# Patient Record
Sex: Male | Born: 1950 | Race: Asian | Hispanic: No | Marital: Married
Health system: Southern US, Community
[De-identification: ages and names within clinical notes are randomized; demographics above are authoritative.]

---

## 2016-10-18 DIAGNOSIS — Z79899 Other long term (current) drug therapy: Secondary | ICD-10-CM | POA: Diagnosis not present

## 2016-10-18 DIAGNOSIS — L408 Other psoriasis: Secondary | ICD-10-CM | POA: Diagnosis not present

## 2016-11-14 DIAGNOSIS — Z79899 Other long term (current) drug therapy: Secondary | ICD-10-CM | POA: Diagnosis not present

## 2016-11-22 DIAGNOSIS — L409 Psoriasis, unspecified: Secondary | ICD-10-CM | POA: Diagnosis not present

## 2016-11-22 DIAGNOSIS — E785 Hyperlipidemia, unspecified: Secondary | ICD-10-CM | POA: Diagnosis not present

## 2016-12-10 DIAGNOSIS — L409 Psoriasis, unspecified: Secondary | ICD-10-CM | POA: Diagnosis not present

## 2016-12-10 DIAGNOSIS — E785 Hyperlipidemia, unspecified: Secondary | ICD-10-CM | POA: Diagnosis not present

## 2016-12-10 DIAGNOSIS — R739 Hyperglycemia, unspecified: Secondary | ICD-10-CM | POA: Diagnosis not present

## 2016-12-20 DIAGNOSIS — Z79899 Other long term (current) drug therapy: Secondary | ICD-10-CM | POA: Diagnosis not present

## 2017-01-14 DIAGNOSIS — R739 Hyperglycemia, unspecified: Secondary | ICD-10-CM | POA: Diagnosis not present

## 2017-01-14 DIAGNOSIS — R634 Abnormal weight loss: Secondary | ICD-10-CM | POA: Diagnosis not present

## 2017-01-14 DIAGNOSIS — L409 Psoriasis, unspecified: Secondary | ICD-10-CM | POA: Diagnosis not present

## 2017-01-14 DIAGNOSIS — E7800 Pure hypercholesterolemia, unspecified: Secondary | ICD-10-CM | POA: Diagnosis not present

## 2017-01-17 DIAGNOSIS — Z79899 Other long term (current) drug therapy: Secondary | ICD-10-CM | POA: Diagnosis not present

## 2017-01-17 DIAGNOSIS — L400 Psoriasis vulgaris: Secondary | ICD-10-CM | POA: Diagnosis not present

## 2017-02-11 DIAGNOSIS — R634 Abnormal weight loss: Secondary | ICD-10-CM | POA: Diagnosis not present

## 2017-02-12 DIAGNOSIS — L409 Psoriasis, unspecified: Secondary | ICD-10-CM | POA: Diagnosis not present

## 2017-02-12 DIAGNOSIS — R7612 Nonspecific reaction to cell mediated immunity measurement of gamma interferon antigen response without active tuberculosis: Secondary | ICD-10-CM | POA: Diagnosis not present

## 2017-02-12 DIAGNOSIS — Z23 Encounter for immunization: Secondary | ICD-10-CM | POA: Diagnosis not present

## 2017-02-14 DIAGNOSIS — R739 Hyperglycemia, unspecified: Secondary | ICD-10-CM | POA: Diagnosis not present

## 2017-02-14 DIAGNOSIS — R634 Abnormal weight loss: Secondary | ICD-10-CM | POA: Diagnosis not present

## 2017-02-14 DIAGNOSIS — E7800 Pure hypercholesterolemia, unspecified: Secondary | ICD-10-CM | POA: Diagnosis not present

## 2017-02-14 DIAGNOSIS — L409 Psoriasis, unspecified: Secondary | ICD-10-CM | POA: Diagnosis not present

## 2017-02-22 DIAGNOSIS — R7612 Nonspecific reaction to cell mediated immunity measurement of gamma interferon antigen response without active tuberculosis: Secondary | ICD-10-CM | POA: Diagnosis not present

## 2017-03-21 DIAGNOSIS — R7612 Nonspecific reaction to cell mediated immunity measurement of gamma interferon antigen response without active tuberculosis: Secondary | ICD-10-CM | POA: Diagnosis not present

## 2017-04-11 DIAGNOSIS — R7611 Nonspecific reaction to tuberculin skin test without active tuberculosis: Secondary | ICD-10-CM | POA: Diagnosis not present

## 2017-04-15 DIAGNOSIS — E7800 Pure hypercholesterolemia, unspecified: Secondary | ICD-10-CM | POA: Diagnosis not present

## 2017-04-15 DIAGNOSIS — R739 Hyperglycemia, unspecified: Secondary | ICD-10-CM | POA: Diagnosis not present

## 2017-04-15 DIAGNOSIS — Z0000 Encounter for general adult medical examination without abnormal findings: Secondary | ICD-10-CM | POA: Diagnosis not present

## 2017-04-15 DIAGNOSIS — L409 Psoriasis, unspecified: Secondary | ICD-10-CM | POA: Diagnosis not present

## 2017-04-22 DIAGNOSIS — E118 Type 2 diabetes mellitus with unspecified complications: Secondary | ICD-10-CM | POA: Diagnosis not present

## 2017-04-22 DIAGNOSIS — Z0000 Encounter for general adult medical examination without abnormal findings: Secondary | ICD-10-CM | POA: Diagnosis not present

## 2017-04-22 DIAGNOSIS — H539 Unspecified visual disturbance: Secondary | ICD-10-CM | POA: Diagnosis not present

## 2017-04-22 DIAGNOSIS — L409 Psoriasis, unspecified: Secondary | ICD-10-CM | POA: Diagnosis not present

## 2017-04-22 DIAGNOSIS — R351 Nocturia: Secondary | ICD-10-CM | POA: Diagnosis not present

## 2017-04-22 DIAGNOSIS — E7800 Pure hypercholesterolemia, unspecified: Secondary | ICD-10-CM | POA: Diagnosis not present

## 2017-06-14 DIAGNOSIS — H25013 Cortical age-related cataract, bilateral: Secondary | ICD-10-CM | POA: Diagnosis not present

## 2017-06-14 DIAGNOSIS — H2513 Age-related nuclear cataract, bilateral: Secondary | ICD-10-CM | POA: Diagnosis not present

## 2017-06-14 DIAGNOSIS — H35033 Hypertensive retinopathy, bilateral: Secondary | ICD-10-CM | POA: Diagnosis not present

## 2017-06-14 DIAGNOSIS — E119 Type 2 diabetes mellitus without complications: Secondary | ICD-10-CM | POA: Diagnosis not present

## 2017-08-01 DIAGNOSIS — Z23 Encounter for immunization: Secondary | ICD-10-CM | POA: Diagnosis not present

## 2017-08-01 DIAGNOSIS — R7611 Nonspecific reaction to tuberculin skin test without active tuberculosis: Secondary | ICD-10-CM | POA: Diagnosis not present

## 2017-08-01 DIAGNOSIS — Z79899 Other long term (current) drug therapy: Secondary | ICD-10-CM | POA: Diagnosis not present

## 2017-08-01 DIAGNOSIS — L409 Psoriasis, unspecified: Secondary | ICD-10-CM | POA: Diagnosis not present

## 2017-09-05 DIAGNOSIS — L408 Other psoriasis: Secondary | ICD-10-CM | POA: Diagnosis not present

## 2017-10-17 DIAGNOSIS — E118 Type 2 diabetes mellitus with unspecified complications: Secondary | ICD-10-CM | POA: Diagnosis not present

## 2017-10-24 DIAGNOSIS — E7800 Pure hypercholesterolemia, unspecified: Secondary | ICD-10-CM | POA: Diagnosis not present

## 2017-10-24 DIAGNOSIS — E119 Type 2 diabetes mellitus without complications: Secondary | ICD-10-CM | POA: Diagnosis not present

## 2017-11-05 DIAGNOSIS — Z792 Long term (current) use of antibiotics: Secondary | ICD-10-CM | POA: Diagnosis not present

## 2017-11-05 DIAGNOSIS — R7611 Nonspecific reaction to tuberculin skin test without active tuberculosis: Secondary | ICD-10-CM | POA: Diagnosis not present

## 2017-12-07 ENCOUNTER — Emergency Department (HOSPITAL_COMMUNITY)
Admission: EM | Admit: 2017-12-07 | Discharge: 2017-12-07 | Disposition: A | Discharge status: Home / Self Care | Payer: PPO | Attending: Emergency Medicine | Admitting: Emergency Medicine

## 2017-12-07 ENCOUNTER — Emergency Department (HOSPITAL_COMMUNITY): Payer: PPO

## 2017-12-07 DIAGNOSIS — W101XXA Fall (on)(from) sidewalk curb, initial encounter: Secondary | ICD-10-CM

## 2017-12-07 DIAGNOSIS — R55 Syncope and collapse: Secondary | ICD-10-CM

## 2017-12-07 DIAGNOSIS — Z23 Encounter for immunization: Secondary | ICD-10-CM | POA: Diagnosis not present

## 2017-12-07 DIAGNOSIS — Z79899 Other long term (current) drug therapy: Secondary | ICD-10-CM | POA: Insufficient documentation

## 2017-12-07 DIAGNOSIS — S0081XA Abrasion of other part of head, initial encounter: Secondary | ICD-10-CM | POA: Diagnosis not present

## 2017-12-07 DIAGNOSIS — R51 Headache: Secondary | ICD-10-CM | POA: Diagnosis not present

## 2017-12-07 LAB — BASIC METABOLIC PANEL
Anion gap: 10 (ref 5–15)
BUN: 15 mg/dL (ref 6–20)
CO2: 27 mmol/L (ref 22–32)
Calcium: 9.3 mg/dL (ref 8.9–10.3)
Chloride: 101 mmol/L (ref 101–111)
Creatinine, Ser: 0.89 mg/dL (ref 0.61–1.24)
GFR calc Af Amer: >60 mL/min (ref >60)
GFR calc non Af Amer: >60 mL/min (ref >60)
Glucose, Bld: 186 mg/dL — ABNORMAL HIGH (ref 65–99)
Potassium: 4.1 mmol/L (ref 3.5–5.1)
Sodium: 138 mmol/L (ref 135–145)

## 2017-12-07 LAB — I-STAT TROPONIN, ED: TROPONIN I, POC: 0 ng/mL (ref 0.00–0.08)

## 2017-12-07 LAB — URINALYSIS, ROUTINE W REFLEX MICROSCOPIC
Bilirubin Urine: NEGATIVE
Glucose, UA: 150 mg/dL — AB
Hgb urine dipstick: NEGATIVE
Ketones, ur: NEGATIVE mg/dL
Leukocytes, UA: NEGATIVE
Nitrite: NEGATIVE
Protein, ur: NEGATIVE mg/dL
Specific Gravity, Urine: 1.016 (ref 1.005–1.030)
pH: 6 (ref 5.0–8.0)

## 2017-12-07 LAB — RAPID URINE DRUG SCREEN, HOSP PERFORMED
Amphetamines: NOT DETECTED
BARBITURATES: NOT DETECTED
Benzodiazepines: NOT DETECTED
Cocaine: NOT DETECTED
Opiates: NOT DETECTED
TETRAHYDROCANNABINOL: NOT DETECTED

## 2017-12-07 LAB — HEPATIC FUNCTION PANEL
ALBUMIN: 3.7 g/dL (ref 3.5–5.0)
ALT: 35 U/L (ref 17–63)
AST: 34 U/L (ref 15–41)
Alkaline Phosphatase: 59 U/L (ref 38–126)
BILIRUBIN DIRECT: 0.1 mg/dL (ref 0.1–0.5)
Indirect Bilirubin: 0.9 mg/dL (ref 0.3–0.9)
TOTAL PROTEIN: 6.1 g/dL — AB (ref 6.5–8.1)
Total Bilirubin: 1 mg/dL (ref 0.3–1.2)

## 2017-12-07 LAB — CBC
HCT: 43.1 % (ref 39.0–52.0)
Hemoglobin: 13.9 g/dL (ref 13.0–17.0)
MCH: 29.6 pg (ref 26.0–34.0)
MCHC: 32.3 g/dL (ref 30.0–36.0)
MCV: 91.9 fL (ref 78.0–100.0)
Platelets: 166 10*3/uL (ref 150–400)
RBC: 4.69 MIL/uL (ref 4.22–5.81)
RDW: 12.9 % (ref 11.5–15.5)
WBC: 7 10*3/uL (ref 4.0–10.5)

## 2017-12-07 LAB — CBG MONITORING, ED: Glucose-Capillary: 203 mg/dL — ABNORMAL HIGH (ref 65–99)

## 2017-12-07 MED ORDER — TETANUS-DIPHTH-ACELL PERTUSSIS 5-2.5-18.5 LF-MCG/0.5 IM SUSP
0.5000 mL | Freq: Once | INTRAMUSCULAR | Status: AC
  Administered 2017-12-07: 0.5 mL via INTRAMUSCULAR
  Filled 2017-12-07: qty 0.5

## 2017-12-07 NOTE — ED Provider Notes (Signed)
[place] EMERGENCY DEPARTMENT Provider Note   CSN: [id number] Arrival date & time: 12/07/17  1418     History   Chief Complaint Chief Complaint  Patient presents with  . Loss of Consciousness    HPI Chad Mcdonald is a 67 y.o. male.  HPI   Patient is a 67 year old male with a history of hyperlipidemia, T2 DM, COPD, pulmonary HTN,  who presents the ED today to be evaluated for syncopal episode that occurred PTA.  Episode was unwitnessed.  States he was squatting outside in an alley way smoking a cigarette when he tried to stand up and passed out.  Denies any prodrome of chest pain, shortness of breath, nausea, lightheadedness, vision changes prior to the episode.  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  He states he has had lightheadedness upon standing after squatting for extended periods of time in the past. He is now complaining of pain to the right side of his face.  Denies any headache or vision changes.  Denies any weakness or numbness to his bilateral upper or lower extremities.  Denies any neck or back pain.  No abdominal pain, nausea, vomiting, shortness of breath, chest pain.  No eye lightheadedness or dizziness.  He has never had an episode like this before.  States he has been in his usual state of health leading up to the incident today.  Denies any drug or alcohol use today.  States that he drank about 1 cup of bourbon last night which is the first time he has consumed alcohol about 15 years.  He is not on any blood thinners.  Patient denies any family history of sudden cardiac death.   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 Has been on isoniazid since 03/21/17 and is planned to have this discontinued at the end of this month. On review of prior notes to denied a h/o TB or known exposures to it. He is also on vit b6 supplementation.  No past  medical history on file.  There are no active problems to display for this patient.   Home Medications    Prior to Admission medications   Medication Sig Start Date End Date Taking? Authorizing Provider  atorvastatin (LIPITOR) 40 MG tablet Take 40 mg by mouth at bedtime. 10/25/17  Yes [provider]  Cholecalciferol (VITAMIN D PO) Take 1 tablet by mouth every evening.   Yes [provider]  isoniazid (NYDRAZID) 300 MG tablet Take 300 mg by mouth daily at 3 pm.  10/17/17  Yes [provider]  metFORMIN (GLUCOPHAGE-XR) 500 MG 24 hr tablet Take 500 mg by mouth at bedtime. 09/26/17  Yes [provider]  Multiple Vitamin (MULTIVITAMIN WITH MINERALS) TABS tablet Take 1 tablet by mouth daily. Centrum   Yes [provider]  triamcinolone ointment (KENALOG) 0.1 % Apply 1 application topically 2 (two) times daily. 09/05/17  Yes [provider]    Family History No family history on file.  Social History Social History   Tobacco Use  . Smoking status: Not on file  Substance Use Topics  . Alcohol use: Not on file  . Drug use: Not on file     Allergies   Patient has no known allergies.   Review of Systems Review of Systems  Constitutional: Negative for fever.  HENT: Negative for congestion, rhinorrhea  and sore throat.        Right sided facial pain  Respiratory: Negative for cough and shortness of breath.   Cardiovascular: Negative for chest pain and palpitations.  Gastrointestinal: Negative for abdominal pain, constipation, diarrhea, nausea and vomiting.  Genitourinary: Negative for flank pain.  Musculoskeletal: Negative for back pain and neck pain.  Skin:       abrasion  Neurological: Positive for syncope. Negative for dizziness, weakness, light-headedness, numbness and headaches.     Physical Exam Updated Vital Signs BP 120/66   Pulse 71   Temp 97.9 F (36.6 C) (Oral)   Resp 18   Ht 5\' 4"  (1.626 m)   Wt 59 kg (130 lb)    SpO2 97%   BMI 22.31 kg/m   Physical Exam  Constitutional: He is oriented to person, place, and time. He appears well-developed and well-nourished. No distress.  Nontoxic-appearing.  HENT:  Head: Normocephalic and atraumatic.  Abrasion to right cheek with some swelling to right side of face.  Mild tenderness to palpation along this area.  No obvious step-off or deformity.  No tenderness to palpation or hematoma to the remainder of the face or skull.  No hemotympanum.  No rhinorrhea.  No battle signs.  Oropharynx clear and moist.  Uvula midline.  Nose normal with no tenderness to palpation.  Eyes: Pupils are equal, round, and reactive to light. Conjunctivae and EOM are normal.  No pain with extraocular movements.  Subconjunctival hemorrhage noted to right side of right eye. No hyphema.  Neck: Normal range of motion. Neck supple.  Full and painless range of motion of the neck.  No C-spine tenderness.  Able to flex to 45 degrees bilaterally without pain.  No tracheal deviation.  Cardiovascular: Normal rate, regular rhythm, normal heart sounds and intact distal pulses.  No murmur heard. Pulmonary/Chest: Effort normal and breath sounds normal. No stridor. No respiratory distress. He has no wheezes. He has no rales.  Abdominal: Soft. Bowel sounds are normal. There is no tenderness. There is no guarding.  Musculoskeletal: He exhibits no edema.  Neurological: He is alert and oriented to person, place, and time.  Mental Status:  Alert, thought content appropriate, able to give a coherent history. Speech fluent without evidence of aphasia. Able to follow 2 step commands without difficulty.  Cranial Nerves:  II:  pupils equal, round, reactive to light III,IV, VI: ptosis not present, extra-ocular motions intact bilaterally  V,VII: smile symmetric, facial light touch sensation equal VIII: hearing grossly normal to voice  X: uvula elevates symmetrically  XI: bilateral shoulder shrug symmetric and  strong XII: midline tongue extension without fassiculations Motor:  Normal tone. 5/5 strength of BUE and BLE major muscle groups including strong and equal grip strength and dorsiflexion/plantar flexion Sensory: light touch normal in all extremities. DTRs: patellar 2+ symmetric b/l Cerebellar: normal finger-to-nose with bilateral upper extremities Gait: normal gait and balance. Able to walk on toes and heels with ease.  CV: 2+ radial and DP/PT pulses Memory intact.  No pronator drift.  Negative Romberg.  Skin: Skin is warm and dry. Capillary refill takes less than 2 seconds.  Abrasions to right cheek, right arm and hand  Psychiatric: He has a normal mood and affect.  Nursing note and vitals reviewed.    ED Treatments / Results  Labs (all labs ordered are listed, but only abnormal results are displayed) Labs Reviewed  BASIC METABOLIC PANEL - Abnormal; Notable for the following components:      Result  Value   Glucose, Bld 186 (*)    All other components within normal limits  URINALYSIS, ROUTINE W REFLEX MICROSCOPIC - Abnormal; Notable for the following components:   Glucose, UA 150 (*)    All other components within normal limits  HEPATIC FUNCTION PANEL - Abnormal; Notable for the following components:   Total Protein 6.1 (*)    All other components within normal limits  CBG MONITORING, ED - Abnormal; Notable for the following components:   Glucose-Capillary 203 (*)    All other components within normal limits  CBC  RAPID URINE DRUG SCREEN, HOSP PERFORMED  I-STAT TROPONIN, ED    EKG EKG Interpretation  Date/Time:  Saturday December 07 2017 14:35:57 EDT Ventricular Rate:  64 PR Interval:  152 QRS Duration: 128 QT Interval:  432 QTC Calculation: 445 R Axis:   62 Text Interpretation:  Normal sinus rhythm Right bundle branch block Abnormal ECG NO STEMI No old tracing to compare Confirmed by Addison Lank [phone]) on 12/07/2017 5:39:24 PM   Radiology Dg Chest 2 View  Result  Date: 12/07/2017 CLINICAL DATA:  Syncope EXAM: CHEST - 2 VIEW COMPARISON:  None. FINDINGS: The heart size and mediastinal contours are within normal limits. Both lungs are clear. The visualized skeletal structures are unremarkable. IMPRESSION: No active cardiopulmonary disease. Electronically Signed   By: Abelardo Diesel M.D.   On: 12/07/2017 17:51   Ct Head Wo Contrast  Result Date: 12/07/2017 CLINICAL DATA:  Patient found down.  Abrasion. EXAM: CT HEAD WITHOUT CONTRAST CT MAXILLOFACIAL WITHOUT CONTRAST TECHNIQUE: Multidetector CT imaging of the head and maxillofacial structures were performed using the standard protocol without intravenous contrast. Multiplanar CT image reconstructions of the maxillofacial structures were also generated. COMPARISON:  None. FINDINGS: CT HEAD FINDINGS Brain: No subdural, epidural, or subarachnoid hemorrhage. Cerebellum, brainstem, and basal cisterns are normal. Ventricles and sulci are unremarkable. No acute cortical ischemia or infarct. No mass effect or midline shift. Vascular: Calcified atherosclerosis is seen in the intracranial carotids. Skull: There is a small amount of fluid in the right maxillary sinus dependently. Paranasal sinuses, mastoid air cells, and middle ears are otherwise normal. No skull fractures are noted. Other: There is soft tissue swelling in the right periorbital region. The underlying globe is intact. Extracranial soft tissues are otherwise normal. CT MAXILLOFACIAL FINDINGS Osseous: No fracture or mandibular dislocation. No destructive process. Orbits: Negative. No traumatic or inflammatory finding. Sinuses: There is high attenuation fluid posteriorly in the right maxillary sinus. The paranasal sinuses are otherwise unremarkable with no other fluid or significant mucosal thickening. No fractures in the walls of the sinuses. Narrowing of the right ostiomeatal complex due to mucosal thickening. Frontal sinus drainage pathways are patent. Soft tissues: There  is soft tissue swelling adjacent to the right orbit. The underlying globe is intact. Extracranial soft tissues are otherwise normal. IMPRESSION: 1. No acute intracranial abnormality identified. 2. Fluid in the right maxillary sinus demonstrates increased attenuation and could represent hemorrhage. However, no underlying cause is seen. No fracture. No facial bone fractures noted. Specifically, the bones of the right orbit are intact. 3. Soft tissue swelling around the right orbit is identified. Electronically Signed   By: Dorise Bullion III M.D   On: 12/07/2017 16:07   Ct Maxillofacial Wo Contrast  Result Date: 12/07/2017 CLINICAL DATA:  Patient found down.  Abrasion. EXAM: CT HEAD WITHOUT CONTRAST CT MAXILLOFACIAL WITHOUT CONTRAST TECHNIQUE: Multidetector CT imaging of the head and maxillofacial structures were performed using the standard protocol without intravenous  contrast. Multiplanar CT image reconstructions of the maxillofacial structures were also generated. COMPARISON:  None. FINDINGS: CT HEAD FINDINGS Brain: No subdural, epidural, or subarachnoid hemorrhage. Cerebellum, brainstem, and basal cisterns are normal. Ventricles and sulci are unremarkable. No acute cortical ischemia or infarct. No mass effect or midline shift. Vascular: Calcified atherosclerosis is seen in the intracranial carotids. Skull: There is a small amount of fluid in the right maxillary sinus dependently. Paranasal sinuses, mastoid air cells, and middle ears are otherwise normal. No skull fractures are noted. Other: There is soft tissue swelling in the right periorbital region. The underlying globe is intact. Extracranial soft tissues are otherwise normal. CT MAXILLOFACIAL FINDINGS Osseous: No fracture or mandibular dislocation. No destructive process. Orbits: Negative. No traumatic or inflammatory finding. Sinuses: There is high attenuation fluid posteriorly in the right maxillary sinus. The paranasal sinuses are otherwise  unremarkable with no other fluid or significant mucosal thickening. No fractures in the walls of the sinuses. Narrowing of the right ostiomeatal complex due to mucosal thickening. Frontal sinus drainage pathways are patent. Soft tissues: There is soft tissue swelling adjacent to the right orbit. The underlying globe is intact. Extracranial soft tissues are otherwise normal. IMPRESSION: 1. No acute intracranial abnormality identified. 2. Fluid in the right maxillary sinus demonstrates increased attenuation and could represent hemorrhage. However, no underlying cause is seen. No fracture. No facial bone fractures noted. Specifically, the bones of the right orbit are intact. 3. Soft tissue swelling around the right orbit is identified. Electronically Signed   By: Dorise Bullion III M.D   On: 12/07/2017 16:07    Procedures Procedures (including critical care time)  Medications Ordered in ED Medications  Tdap (BOOSTRIX) injection 0.5 mL (0.5 mLs Intramuscular Given 12/07/17 1836)     Initial Impression / Assessment and Plan / ED Course  I have reviewed the triage vital signs and the nursing notes.  Pertinent labs & imaging results that were available during my care of the patient were reviewed by me and considered in my medical decision making (see chart for details).    Discussed pt presentation and exam findings with Dr. Leonette Monarch, who personally evaluated the pt and advises that the pt be discharged with outpt f/u and with referral to maxillofacial.   Final Clinical Impressions(s) / ED Diagnoses   Final diagnoses:  Syncope, unspecified syncope type   Patient without arrhythmia or tachycardia while here in the department.  Patient without history of congestive heart failure, normal hematocrit, ECG with NSR and RBBB which is old. No ischemic changes or arrhythmia, no shortness of breath and systolic blood pressure greater than 90; patient is low risk. Will plan for discharge home with close PCP  follow-up. Suspect symptoms due to orthostatic hypotension based on history. CT head negative for acute intracranial abnormality. CT maxillofacial with increased attenuation which could represent hemorrhage. No underlying cause or facial fractures identified. Advised pt to f/u with maxillofacial surgery for further eval and tx of possible right maxillary sinus hemorrhage.   Pt has remained hemodynamically stable throughout their time in the ED, he is in NAD and is nontoxic appearing. At this time do not suspect urgent underlying cardiac, pulmonary, or neurological etiology of patients sxs. labwork reviewed and is crossly normal. cxr negative. Pt safe for d/c with close outpt f/u. Wounds cleansed in ED and tdap updated.   BP 120/66   Pulse 71   Temp 97.9 F (36.6 C) (Oral)   Resp 18   Ht 5\' 4"  (1.626 m)  Wt 59 kg (130 lb)   SpO2 97%   BMI 22.31 kg/m    ED Discharge Orders    None       Bishop Dublin 12/07/17 2138    Fatima Blank, MD 12/08/17 1329

## 2017-12-07 NOTE — ED Notes (Signed)
Lab to add on UDS 

## 2017-12-07 NOTE — ED Triage Notes (Signed)
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

## 2017-12-07 NOTE — ED Notes (Signed)
Patient transported to X-ray 

## 2017-12-07 NOTE — Discharge Instructions (Signed)
You were found to have possible bleeding in the right maxillary sinus which could represent blood in your sinus.  You will need to follow up with Dr. Iran Planas from plastic surgery for further evaluation and treatment of this. You will also need to follow up with your primary doctor in 5-7 days for re-evaluation. You should return to the ED for any new or worsening symptoms including any chest pain, shortness of breath, passing out, lightheadedness.

## 2017-12-16 DIAGNOSIS — N529 Male erectile dysfunction, unspecified: Secondary | ICD-10-CM | POA: Diagnosis not present

## 2017-12-16 DIAGNOSIS — R05 Cough: Secondary | ICD-10-CM | POA: Diagnosis not present

## 2017-12-16 DIAGNOSIS — R55 Syncope and collapse: Secondary | ICD-10-CM | POA: Diagnosis not present

## 2017-12-16 DIAGNOSIS — R0789 Other chest pain: Secondary | ICD-10-CM | POA: Diagnosis not present

## 2017-12-16 DIAGNOSIS — W19XXXD Unspecified fall, subsequent encounter: Secondary | ICD-10-CM | POA: Diagnosis not present

## 2018-01-31 DIAGNOSIS — E119 Type 2 diabetes mellitus without complications: Secondary | ICD-10-CM | POA: Diagnosis not present

## 2018-01-31 DIAGNOSIS — E7800 Pure hypercholesterolemia, unspecified: Secondary | ICD-10-CM | POA: Diagnosis not present

## 2018-02-07 DIAGNOSIS — E7800 Pure hypercholesterolemia, unspecified: Secondary | ICD-10-CM | POA: Diagnosis not present

## 2018-02-07 DIAGNOSIS — E119 Type 2 diabetes mellitus without complications: Secondary | ICD-10-CM | POA: Diagnosis not present

## 2018-02-07 DIAGNOSIS — E559 Vitamin D deficiency, unspecified: Secondary | ICD-10-CM | POA: Diagnosis not present

## 2018-02-07 DIAGNOSIS — N529 Male erectile dysfunction, unspecified: Secondary | ICD-10-CM | POA: Diagnosis not present

## 2018-06-03 DIAGNOSIS — E118 Type 2 diabetes mellitus with unspecified complications: Secondary | ICD-10-CM | POA: Diagnosis not present

## 2018-06-03 DIAGNOSIS — E7800 Pure hypercholesterolemia, unspecified: Secondary | ICD-10-CM | POA: Diagnosis not present

## 2018-06-10 DIAGNOSIS — R05 Cough: Secondary | ICD-10-CM | POA: Diagnosis not present

## 2018-06-10 DIAGNOSIS — E559 Vitamin D deficiency, unspecified: Secondary | ICD-10-CM | POA: Diagnosis not present

## 2018-06-10 DIAGNOSIS — E118 Type 2 diabetes mellitus with unspecified complications: Secondary | ICD-10-CM | POA: Diagnosis not present

## 2018-06-10 DIAGNOSIS — N529 Male erectile dysfunction, unspecified: Secondary | ICD-10-CM | POA: Diagnosis not present

## 2018-06-10 DIAGNOSIS — E7800 Pure hypercholesterolemia, unspecified: Secondary | ICD-10-CM | POA: Diagnosis not present

## 2018-06-27 DIAGNOSIS — H2513 Age-related nuclear cataract, bilateral: Secondary | ICD-10-CM | POA: Diagnosis not present

## 2018-06-27 DIAGNOSIS — E119 Type 2 diabetes mellitus without complications: Secondary | ICD-10-CM | POA: Diagnosis not present

## 2018-06-27 DIAGNOSIS — H35033 Hypertensive retinopathy, bilateral: Secondary | ICD-10-CM | POA: Diagnosis not present

## 2018-06-27 DIAGNOSIS — H25013 Cortical age-related cataract, bilateral: Secondary | ICD-10-CM | POA: Diagnosis not present

## 2018-08-03 DIAGNOSIS — S299XXA Unspecified injury of thorax, initial encounter: Secondary | ICD-10-CM | POA: Diagnosis not present

## 2018-08-03 DIAGNOSIS — S2232XA Fracture of one rib, left side, initial encounter for closed fracture: Secondary | ICD-10-CM | POA: Diagnosis not present

## 2018-08-03 DIAGNOSIS — R58 Hemorrhage, not elsewhere classified: Secondary | ICD-10-CM | POA: Diagnosis not present

## 2018-08-03 DIAGNOSIS — S60512A Abrasion of left hand, initial encounter: Secondary | ICD-10-CM | POA: Diagnosis not present

## 2018-08-03 DIAGNOSIS — Y92410 Unspecified street and highway as the place of occurrence of the external cause: Secondary | ICD-10-CM | POA: Diagnosis not present

## 2018-08-03 DIAGNOSIS — R0789 Other chest pain: Secondary | ICD-10-CM | POA: Diagnosis not present

## 2018-08-03 DIAGNOSIS — S3991XA Unspecified injury of abdomen, initial encounter: Secondary | ICD-10-CM | POA: Diagnosis not present

## 2018-08-03 DIAGNOSIS — S8992XA Unspecified injury of left lower leg, initial encounter: Secondary | ICD-10-CM | POA: Diagnosis not present

## 2018-08-03 DIAGNOSIS — K5730 Diverticulosis of large intestine without perforation or abscess without bleeding: Secondary | ICD-10-CM | POA: Diagnosis not present

## 2018-08-03 DIAGNOSIS — M7989 Other specified soft tissue disorders: Secondary | ICD-10-CM | POA: Diagnosis not present

## 2018-08-03 DIAGNOSIS — S0081XA Abrasion of other part of head, initial encounter: Secondary | ICD-10-CM | POA: Diagnosis not present

## 2018-08-03 DIAGNOSIS — S199XXA Unspecified injury of neck, initial encounter: Secondary | ICD-10-CM | POA: Diagnosis not present

## 2018-08-03 DIAGNOSIS — R51 Headache: Secondary | ICD-10-CM | POA: Diagnosis not present

## 2018-08-03 DIAGNOSIS — I2510 Atherosclerotic heart disease of native coronary artery without angina pectoris: Secondary | ICD-10-CM | POA: Diagnosis not present

## 2018-08-03 DIAGNOSIS — S0990XA Unspecified injury of head, initial encounter: Secondary | ICD-10-CM | POA: Diagnosis not present

## 2018-08-03 DIAGNOSIS — Z23 Encounter for immunization: Secondary | ICD-10-CM | POA: Diagnosis not present

## 2018-08-03 DIAGNOSIS — S00211A Abrasion of right eyelid and periocular area, initial encounter: Secondary | ICD-10-CM | POA: Diagnosis not present

## 2018-08-03 DIAGNOSIS — S8001XA Contusion of right knee, initial encounter: Secondary | ICD-10-CM | POA: Diagnosis not present

## 2018-08-03 DIAGNOSIS — S0001XA Abrasion of scalp, initial encounter: Secondary | ICD-10-CM | POA: Diagnosis not present

## 2018-08-03 DIAGNOSIS — S8012XA Contusion of left lower leg, initial encounter: Secondary | ICD-10-CM | POA: Diagnosis not present

## 2018-08-03 DIAGNOSIS — R079 Chest pain, unspecified: Secondary | ICD-10-CM | POA: Diagnosis not present

## 2018-08-03 DIAGNOSIS — S60511A Abrasion of right hand, initial encounter: Secondary | ICD-10-CM | POA: Diagnosis not present

## 2018-08-03 DIAGNOSIS — I700 Atherosclerosis of aorta: Secondary | ICD-10-CM | POA: Diagnosis not present

## 2018-08-03 DIAGNOSIS — K8020 Calculus of gallbladder without cholecystitis without obstruction: Secondary | ICD-10-CM | POA: Diagnosis not present

## 2018-08-03 DIAGNOSIS — S8991XA Unspecified injury of right lower leg, initial encounter: Secondary | ICD-10-CM | POA: Diagnosis not present

## 2018-08-04 DIAGNOSIS — R079 Chest pain, unspecified: Secondary | ICD-10-CM | POA: Diagnosis not present

## 2018-08-04 DIAGNOSIS — S299XXA Unspecified injury of thorax, initial encounter: Secondary | ICD-10-CM | POA: Diagnosis not present

## 2018-08-04 DIAGNOSIS — I4510 Unspecified right bundle-branch block: Secondary | ICD-10-CM | POA: Diagnosis not present

## 2018-08-04 DIAGNOSIS — S3991XA Unspecified injury of abdomen, initial encounter: Secondary | ICD-10-CM | POA: Diagnosis not present

## 2018-08-05 DIAGNOSIS — Y92410 Unspecified street and highway as the place of occurrence of the external cause: Secondary | ICD-10-CM | POA: Diagnosis not present

## 2018-08-05 DIAGNOSIS — I4510 Unspecified right bundle-branch block: Secondary | ICD-10-CM | POA: Diagnosis not present

## 2018-08-05 DIAGNOSIS — Y998 Other external cause status: Secondary | ICD-10-CM | POA: Diagnosis not present

## 2018-08-05 DIAGNOSIS — R2242 Localized swelling, mass and lump, left lower limb: Secondary | ICD-10-CM | POA: Diagnosis not present

## 2018-08-05 DIAGNOSIS — R079 Chest pain, unspecified: Secondary | ICD-10-CM | POA: Diagnosis not present

## 2018-08-05 DIAGNOSIS — S3991XA Unspecified injury of abdomen, initial encounter: Secondary | ICD-10-CM | POA: Diagnosis not present

## 2018-08-05 DIAGNOSIS — S299XXA Unspecified injury of thorax, initial encounter: Secondary | ICD-10-CM | POA: Diagnosis not present

## 2018-08-05 DIAGNOSIS — S8012XA Contusion of left lower leg, initial encounter: Secondary | ICD-10-CM | POA: Diagnosis not present

## 2018-08-06 DIAGNOSIS — M79605 Pain in left leg: Secondary | ICD-10-CM | POA: Diagnosis not present

## 2018-08-06 DIAGNOSIS — M79662 Pain in left lower leg: Secondary | ICD-10-CM | POA: Diagnosis not present

## 2018-08-08 DIAGNOSIS — S8001XA Contusion of right knee, initial encounter: Secondary | ICD-10-CM | POA: Diagnosis not present

## 2018-08-08 DIAGNOSIS — S2232XD Fracture of one rib, left side, subsequent encounter for fracture with routine healing: Secondary | ICD-10-CM | POA: Diagnosis not present

## 2018-08-08 DIAGNOSIS — E118 Type 2 diabetes mellitus with unspecified complications: Secondary | ICD-10-CM | POA: Diagnosis not present

## 2018-09-15 DIAGNOSIS — H2511 Age-related nuclear cataract, right eye: Secondary | ICD-10-CM | POA: Diagnosis not present

## 2018-09-15 DIAGNOSIS — H25011 Cortical age-related cataract, right eye: Secondary | ICD-10-CM | POA: Diagnosis not present

## 2018-09-15 DIAGNOSIS — H25013 Cortical age-related cataract, bilateral: Secondary | ICD-10-CM | POA: Diagnosis not present

## 2018-09-15 DIAGNOSIS — E113392 Type 2 diabetes mellitus with moderate nonproliferative diabetic retinopathy without macular edema, left eye: Secondary | ICD-10-CM | POA: Diagnosis not present

## 2018-09-15 DIAGNOSIS — H2513 Age-related nuclear cataract, bilateral: Secondary | ICD-10-CM | POA: Diagnosis not present

## 2018-09-15 DIAGNOSIS — H35033 Hypertensive retinopathy, bilateral: Secondary | ICD-10-CM | POA: Diagnosis not present

## 2018-09-30 DIAGNOSIS — H25811 Combined forms of age-related cataract, right eye: Secondary | ICD-10-CM | POA: Diagnosis not present

## 2018-09-30 DIAGNOSIS — H2511 Age-related nuclear cataract, right eye: Secondary | ICD-10-CM | POA: Diagnosis not present

## 2018-10-20 DIAGNOSIS — H25012 Cortical age-related cataract, left eye: Secondary | ICD-10-CM | POA: Diagnosis not present

## 2018-10-20 DIAGNOSIS — H2512 Age-related nuclear cataract, left eye: Secondary | ICD-10-CM | POA: Diagnosis not present

## 2018-10-22 DIAGNOSIS — Z0000 Encounter for general adult medical examination without abnormal findings: Secondary | ICD-10-CM | POA: Diagnosis not present

## 2018-10-28 DIAGNOSIS — H2512 Age-related nuclear cataract, left eye: Secondary | ICD-10-CM | POA: Diagnosis not present

## 2018-10-28 DIAGNOSIS — H25812 Combined forms of age-related cataract, left eye: Secondary | ICD-10-CM | POA: Diagnosis not present

## 2018-10-29 DIAGNOSIS — S2232XD Fracture of one rib, left side, subsequent encounter for fracture with routine healing: Secondary | ICD-10-CM | POA: Diagnosis not present

## 2018-10-29 DIAGNOSIS — E118 Type 2 diabetes mellitus with unspecified complications: Secondary | ICD-10-CM | POA: Diagnosis not present

## 2018-10-29 DIAGNOSIS — N529 Male erectile dysfunction, unspecified: Secondary | ICD-10-CM | POA: Diagnosis not present

## 2018-10-29 DIAGNOSIS — R05 Cough: Secondary | ICD-10-CM | POA: Diagnosis not present

## 2018-10-29 DIAGNOSIS — E559 Vitamin D deficiency, unspecified: Secondary | ICD-10-CM | POA: Diagnosis not present

## 2018-10-29 DIAGNOSIS — E7800 Pure hypercholesterolemia, unspecified: Secondary | ICD-10-CM | POA: Diagnosis not present

## 2018-10-29 DIAGNOSIS — F17210 Nicotine dependence, cigarettes, uncomplicated: Secondary | ICD-10-CM | POA: Diagnosis not present

## 2018-10-29 DIAGNOSIS — Z0000 Encounter for general adult medical examination without abnormal findings: Secondary | ICD-10-CM | POA: Diagnosis not present

## 2018-11-01 IMAGING — CT CT MAXILLOFACIAL W/O CM
4 of 12 series · 15 of 47 positions shown, 17 images · non-contrast
Comparison: None.

CLINICAL DATA: Patient found down.  Abrasion.

EXAM:
CT HEAD WITHOUT CONTRAST
CT MAXILLOFACIAL WITHOUT CONTRAST
TECHNIQUE: Multidetector CT imaging of the head and maxillofacial structures
were performed using the standard protocol without intravenous
contrast. Multiplanar CT image reconstructions of the maxillofacial
structures were also generated.

[Series 8: st thins · axial · 0.33mm/px · z∈[-161,-42]mm · 7 of 228 slices shown, 9 images]
[im 29/228  brain]
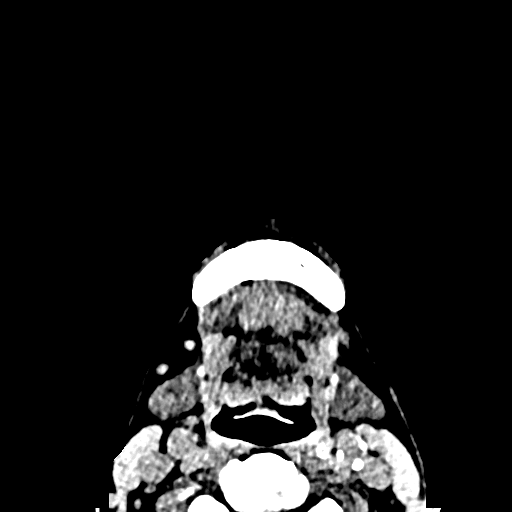
[im 29/228  bone]
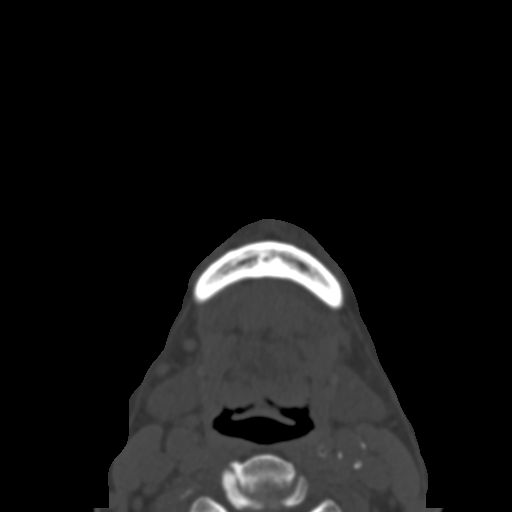
[im 57/228  bone]
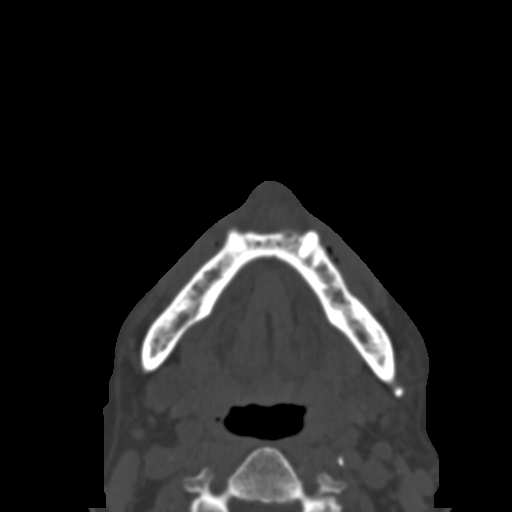
[im 86/228  bone]
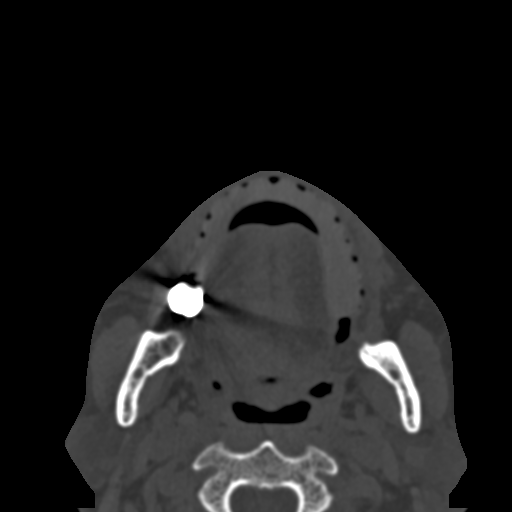
[im 114/228  bone]
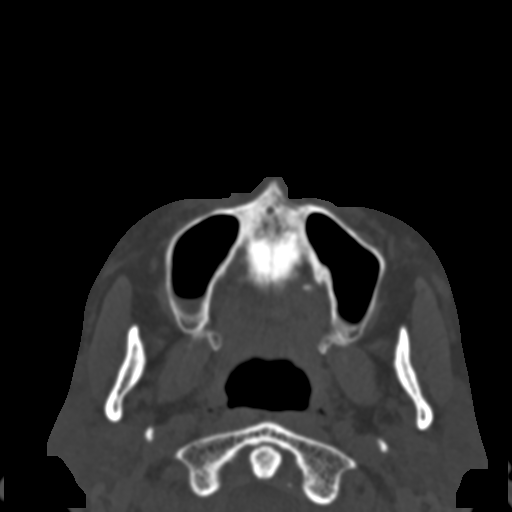
[im 142/228  brain]
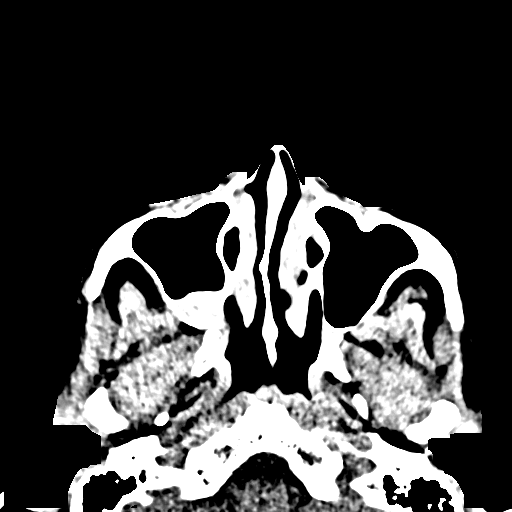
[im 142/228  bone]
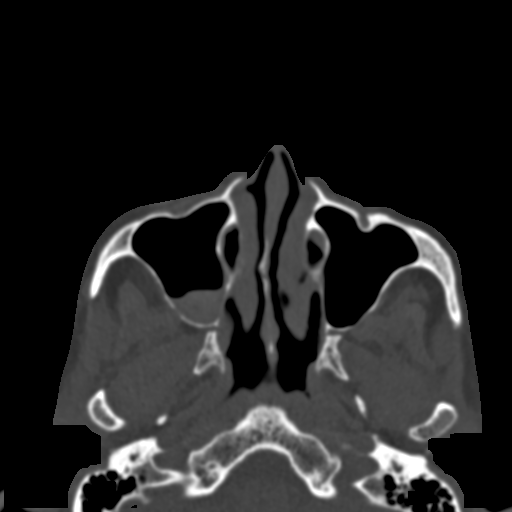
[im 171/228  bone]
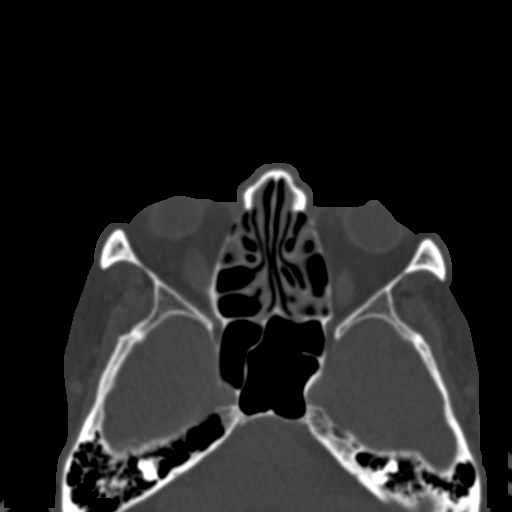
[im 199/228  bone]
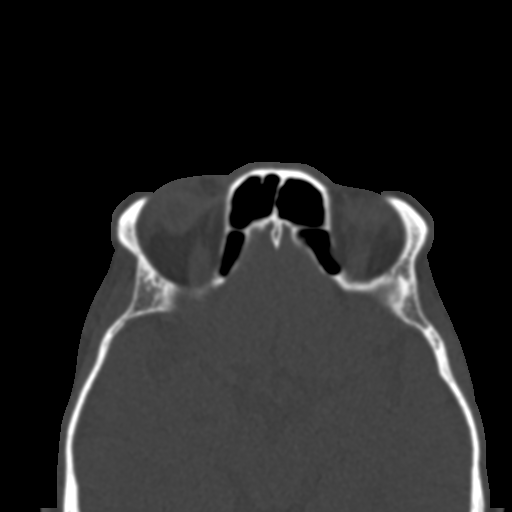

[Series 10: bone thins · axial · 0.33mm/px · z∈[-161,-82]mm · 5 of 228 slices shown]
[im 29/228  bone]
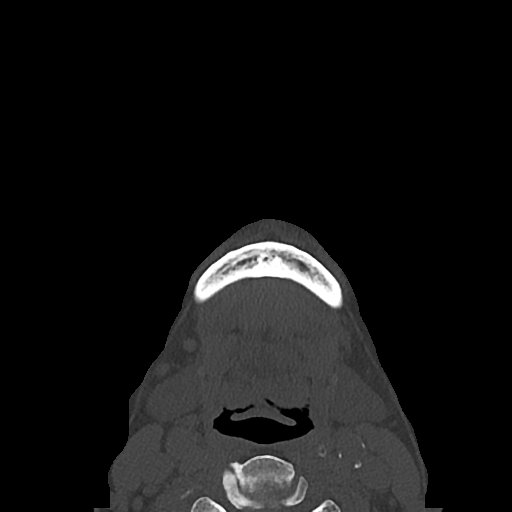
[im 57/228  bone]
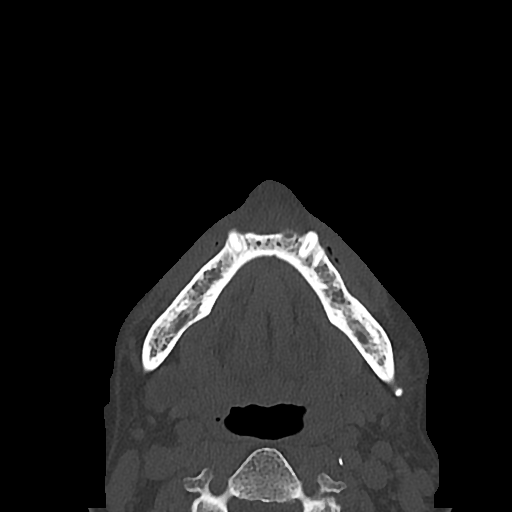
[im 86/228  bone]
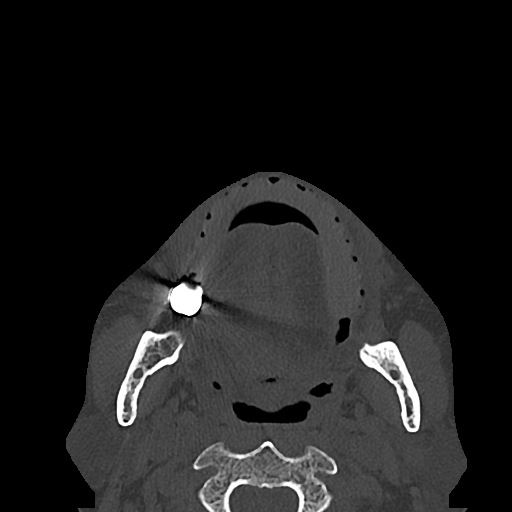
[im 114/228  bone]
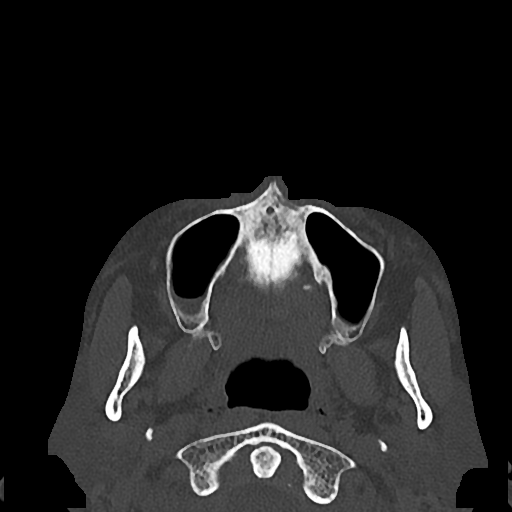
[im 142/228  bone]
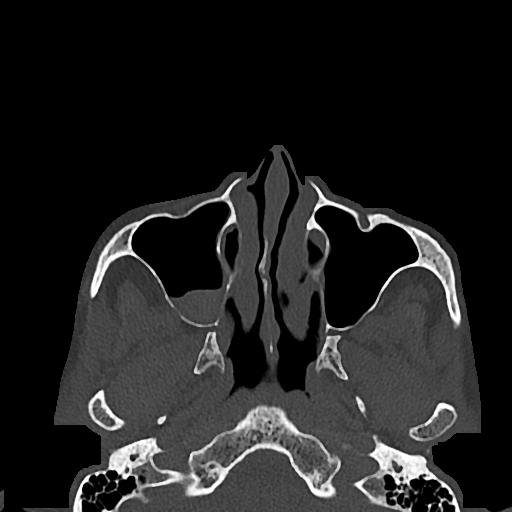

[Series 11: st cor · coronal · 0.31mm/px · 2 of 76 slices shown]
[im 26/76  bone]
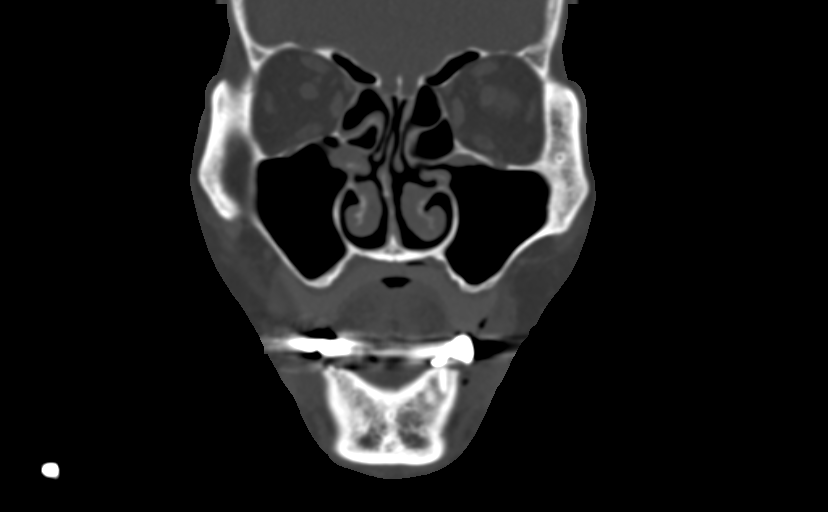
[im 51/76  bone]
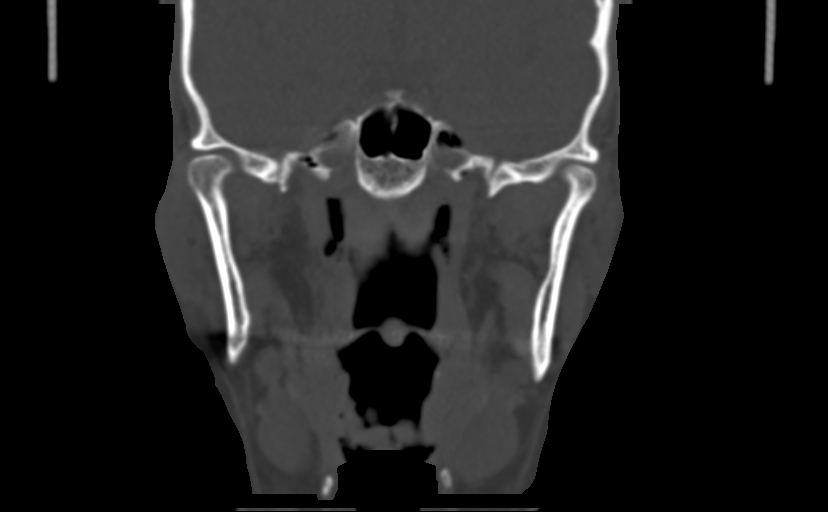

[Series 12: st sag · sagittal · 0.31mm/px · 1 of 80 slices shown]
[im 40/80  bone]
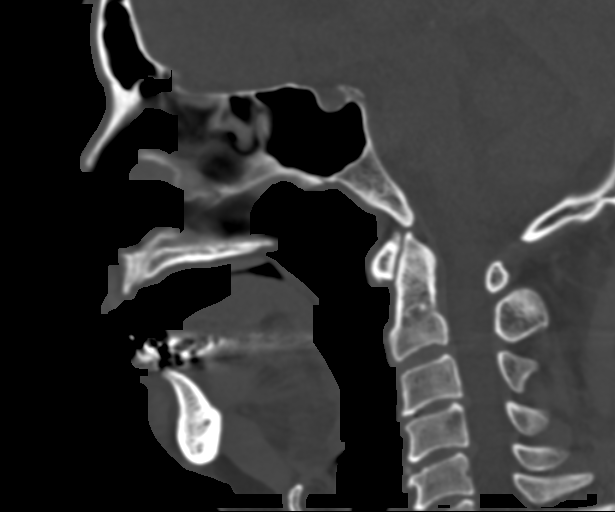

[15 of 47 positions shown; findings below may reference images not displayed]

FINDINGS: CT HEAD FINDINGS

Brain: No subdural, epidural, or subarachnoid hemorrhage.
Cerebellum, brainstem, and basal cisterns are normal. Ventricles and
sulci are unremarkable. No acute cortical ischemia or infarct. No
mass effect or midline shift.

Vascular: Calcified atherosclerosis is seen in the intracranial
carotids.

Skull: There is a small amount of fluid in the right maxillary sinus
dependently. Paranasal sinuses, mastoid air cells, and middle ears
are otherwise normal. No skull fractures are noted.

Other: There is soft tissue swelling in the right periorbital
region. The underlying globe is intact. Extracranial soft tissues
are otherwise normal.

CT MAXILLOFACIAL FINDINGS

Osseous: No fracture or mandibular dislocation. No destructive
process.

Orbits: Negative. No traumatic or inflammatory finding.

Sinuses: There is high attenuation fluid posteriorly in the right
maxillary sinus. The paranasal sinuses are otherwise unremarkable
with no other fluid or significant mucosal thickening. No fractures
in the walls of the sinuses. Narrowing of the right ostiomeatal
complex due to mucosal thickening. Frontal sinus drainage pathways
are patent.

Soft tissues: There is soft tissue swelling adjacent to the right
orbit. The underlying globe is intact. Extracranial soft tissues are
otherwise normal.
IMPRESSION: 1. No acute intracranial abnormality identified.
2. Fluid in the right maxillary sinus demonstrates increased
attenuation and could represent hemorrhage. However, no underlying
cause is seen. No fracture. No facial bone fractures noted.
Specifically, the bones of the right orbit are intact.
3. Soft tissue swelling around the right orbit is identified.

## 2019-02-20 DIAGNOSIS — E118 Type 2 diabetes mellitus with unspecified complications: Secondary | ICD-10-CM | POA: Diagnosis not present

## 2019-02-20 DIAGNOSIS — E7800 Pure hypercholesterolemia, unspecified: Secondary | ICD-10-CM | POA: Diagnosis not present

## 2019-02-20 DIAGNOSIS — Z125 Encounter for screening for malignant neoplasm of prostate: Secondary | ICD-10-CM | POA: Diagnosis not present

## 2019-02-27 DIAGNOSIS — F17200 Nicotine dependence, unspecified, uncomplicated: Secondary | ICD-10-CM | POA: Diagnosis not present

## 2019-02-27 DIAGNOSIS — E785 Hyperlipidemia, unspecified: Secondary | ICD-10-CM | POA: Diagnosis not present

## 2019-02-27 DIAGNOSIS — E559 Vitamin D deficiency, unspecified: Secondary | ICD-10-CM | POA: Diagnosis not present

## 2019-02-27 DIAGNOSIS — L409 Psoriasis, unspecified: Secondary | ICD-10-CM | POA: Diagnosis not present

## 2019-02-27 DIAGNOSIS — R739 Hyperglycemia, unspecified: Secondary | ICD-10-CM | POA: Diagnosis not present

## 2019-02-27 DIAGNOSIS — N529 Male erectile dysfunction, unspecified: Secondary | ICD-10-CM | POA: Diagnosis not present

## 2019-02-27 DIAGNOSIS — K219 Gastro-esophageal reflux disease without esophagitis: Secondary | ICD-10-CM | POA: Diagnosis not present

## 2019-02-27 DIAGNOSIS — E118 Type 2 diabetes mellitus with unspecified complications: Secondary | ICD-10-CM | POA: Diagnosis not present

## 2019-02-27 DIAGNOSIS — H539 Unspecified visual disturbance: Secondary | ICD-10-CM | POA: Diagnosis not present

## 2019-03-25 DIAGNOSIS — L400 Psoriasis vulgaris: Secondary | ICD-10-CM | POA: Diagnosis not present

## 2019-04-22 DIAGNOSIS — L400 Psoriasis vulgaris: Secondary | ICD-10-CM | POA: Diagnosis not present

## 2019-05-25 DIAGNOSIS — L400 Psoriasis vulgaris: Secondary | ICD-10-CM | POA: Diagnosis not present

## 2019-05-29 DIAGNOSIS — H353132 Nonexudative age-related macular degeneration, bilateral, intermediate dry stage: Secondary | ICD-10-CM | POA: Diagnosis not present

## 2019-05-29 DIAGNOSIS — Z961 Presence of intraocular lens: Secondary | ICD-10-CM | POA: Diagnosis not present

## 2019-05-29 DIAGNOSIS — E113293 Type 2 diabetes mellitus with mild nonproliferative diabetic retinopathy without macular edema, bilateral: Secondary | ICD-10-CM | POA: Diagnosis not present

## 2019-05-29 DIAGNOSIS — H35033 Hypertensive retinopathy, bilateral: Secondary | ICD-10-CM | POA: Diagnosis not present

## 2019-06-23 DIAGNOSIS — L400 Psoriasis vulgaris: Secondary | ICD-10-CM | POA: Diagnosis not present

## 2019-07-07 DIAGNOSIS — Z1159 Encounter for screening for other viral diseases: Secondary | ICD-10-CM | POA: Diagnosis not present

## 2019-07-07 DIAGNOSIS — Z1389 Encounter for screening for other disorder: Secondary | ICD-10-CM | POA: Diagnosis not present

## 2019-07-07 DIAGNOSIS — Z23 Encounter for immunization: Secondary | ICD-10-CM | POA: Diagnosis not present

## 2019-07-07 DIAGNOSIS — E1165 Type 2 diabetes mellitus with hyperglycemia: Secondary | ICD-10-CM | POA: Diagnosis not present

## 2019-07-07 DIAGNOSIS — M129 Arthropathy, unspecified: Secondary | ICD-10-CM | POA: Diagnosis not present

## 2019-07-07 DIAGNOSIS — F17210 Nicotine dependence, cigarettes, uncomplicated: Secondary | ICD-10-CM | POA: Diagnosis not present

## 2019-07-07 DIAGNOSIS — E7800 Pure hypercholesterolemia, unspecified: Secondary | ICD-10-CM | POA: Diagnosis not present

## 2019-07-07 DIAGNOSIS — R5383 Other fatigue: Secondary | ICD-10-CM | POA: Diagnosis not present

## 2019-07-07 DIAGNOSIS — E559 Vitamin D deficiency, unspecified: Secondary | ICD-10-CM | POA: Diagnosis not present

## 2019-07-25 DIAGNOSIS — Z1211 Encounter for screening for malignant neoplasm of colon: Secondary | ICD-10-CM | POA: Diagnosis not present

## 2019-09-09 DIAGNOSIS — L400 Psoriasis vulgaris: Secondary | ICD-10-CM | POA: Diagnosis not present

## 2019-09-11 DIAGNOSIS — Z01818 Encounter for other preprocedural examination: Secondary | ICD-10-CM | POA: Diagnosis not present

## 2019-09-12 DIAGNOSIS — E7800 Pure hypercholesterolemia, unspecified: Secondary | ICD-10-CM | POA: Diagnosis not present

## 2019-09-12 DIAGNOSIS — E1165 Type 2 diabetes mellitus with hyperglycemia: Secondary | ICD-10-CM | POA: Diagnosis not present

## 2019-09-14 DIAGNOSIS — Z1211 Encounter for screening for malignant neoplasm of colon: Secondary | ICD-10-CM | POA: Diagnosis not present

## 2019-09-14 DIAGNOSIS — K635 Polyp of colon: Secondary | ICD-10-CM | POA: Diagnosis not present

## 2019-09-14 DIAGNOSIS — Z01818 Encounter for other preprocedural examination: Secondary | ICD-10-CM | POA: Diagnosis not present

## 2019-09-14 DIAGNOSIS — E1165 Type 2 diabetes mellitus with hyperglycemia: Secondary | ICD-10-CM | POA: Diagnosis not present

## 2019-09-16 DIAGNOSIS — I270 Primary pulmonary hypertension: Secondary | ICD-10-CM | POA: Diagnosis not present

## 2019-09-16 DIAGNOSIS — R9431 Abnormal electrocardiogram [ECG] [EKG]: Secondary | ICD-10-CM | POA: Diagnosis not present

## 2019-09-18 DIAGNOSIS — K635 Polyp of colon: Secondary | ICD-10-CM | POA: Diagnosis not present

## 2019-10-07 DIAGNOSIS — K648 Other hemorrhoids: Secondary | ICD-10-CM | POA: Diagnosis not present

## 2019-10-07 DIAGNOSIS — D126 Benign neoplasm of colon, unspecified: Secondary | ICD-10-CM | POA: Diagnosis not present

## 2019-10-18 ENCOUNTER — Ambulatory Visit: Payer: PPO | Attending: Internal Medicine

## 2019-10-18 DIAGNOSIS — Z23 Encounter for immunization: Secondary | ICD-10-CM | POA: Insufficient documentation

## 2019-10-18 NOTE — Progress Notes (Signed)
   Covid-19 Vaccination Clinic  Name:  JASE VOLKOV    MRN: [id number] DOB: 10-Jul-1951  10/18/2019  Mr. Zettle was observed post Covid-19 immunization for 15 minutes without incidence. He was provided with Vaccine Information Sheet and instruction to access the V-Safe system.   Mr. Whittenberg was instructed to call 911 with any severe reactions post vaccine: Marland Kitchen Difficulty breathing  . Swelling of your face and throat  . A fast heartbeat  . A bad rash all over your body  . Dizziness and weakness    Immunizations Administered    Name Date Dose VIS Date Route   Pfizer COVID-19 Vaccine 10/18/2019  1:52 PM 0.3 mL 08/14/2019 Intramuscular   Manufacturer: [place]   Lot: [id number]   [place]: [id number]

## 2019-11-10 ENCOUNTER — Ambulatory Visit: Payer: PPO | Attending: Internal Medicine

## 2019-11-10 DIAGNOSIS — Z23 Encounter for immunization: Secondary | ICD-10-CM | POA: Insufficient documentation

## 2019-11-10 NOTE — Progress Notes (Signed)
   Covid-19 Vaccination Clinic  Name:  Chad Mcdonald    MRN: [id number] DOB: July 01, 1951  11/10/2019  Chad Mcdonald was observed post Covid-19 immunization for 15 minutes without incident. He was provided with Vaccine Information Sheet and instruction to access the V-Safe system.   Chad Mcdonald was instructed to call 911 with any severe reactions post vaccine: Marland Kitchen Difficulty breathing  . Swelling of face and throat  . A fast heartbeat  . A bad rash all over body  . Dizziness and weakness   Immunizations Administered    Name Date Dose VIS Date Route   Pfizer COVID-19 Vaccine 11/10/2019  4:34 PM 0.3 mL 08/14/2019 Intramuscular   Manufacturer: [place]   Lot: [id number]   [place]: [id number]

## 2019-11-11 ENCOUNTER — Ambulatory Visit: Payer: PPO

## 2020-02-09 DIAGNOSIS — Z1159 Encounter for screening for other viral diseases: Secondary | ICD-10-CM | POA: Diagnosis not present

## 2020-02-09 DIAGNOSIS — Z1331 Encounter for screening for depression: Secondary | ICD-10-CM | POA: Diagnosis not present

## 2020-02-09 DIAGNOSIS — E782 Mixed hyperlipidemia: Secondary | ICD-10-CM | POA: Diagnosis not present

## 2020-02-09 DIAGNOSIS — Z1339 Encounter for screening examination for other mental health and behavioral disorders: Secondary | ICD-10-CM | POA: Diagnosis not present

## 2020-02-09 DIAGNOSIS — F17210 Nicotine dependence, cigarettes, uncomplicated: Secondary | ICD-10-CM | POA: Diagnosis not present

## 2020-02-09 DIAGNOSIS — Z79899 Other long term (current) drug therapy: Secondary | ICD-10-CM | POA: Diagnosis not present

## 2020-02-09 DIAGNOSIS — E1165 Type 2 diabetes mellitus with hyperglycemia: Secondary | ICD-10-CM | POA: Diagnosis not present

## 2020-02-11 DIAGNOSIS — Z122 Encounter for screening for malignant neoplasm of respiratory organs: Secondary | ICD-10-CM | POA: Diagnosis not present

## 2020-03-09 DIAGNOSIS — L400 Psoriasis vulgaris: Secondary | ICD-10-CM | POA: Diagnosis not present

## 2020-04-16 DIAGNOSIS — F17210 Nicotine dependence, cigarettes, uncomplicated: Secondary | ICD-10-CM | POA: Diagnosis not present

## 2020-04-16 DIAGNOSIS — Z79899 Other long term (current) drug therapy: Secondary | ICD-10-CM | POA: Diagnosis not present

## 2020-04-16 DIAGNOSIS — E1165 Type 2 diabetes mellitus with hyperglycemia: Secondary | ICD-10-CM | POA: Diagnosis not present

## 2020-04-22 DIAGNOSIS — L400 Psoriasis vulgaris: Secondary | ICD-10-CM | POA: Diagnosis not present

## 2020-05-18 DIAGNOSIS — H04123 Dry eye syndrome of bilateral lacrimal glands: Secondary | ICD-10-CM | POA: Diagnosis not present

## 2020-05-18 DIAGNOSIS — H35033 Hypertensive retinopathy, bilateral: Secondary | ICD-10-CM | POA: Diagnosis not present

## 2020-05-18 DIAGNOSIS — H524 Presbyopia: Secondary | ICD-10-CM | POA: Diagnosis not present

## 2020-05-18 DIAGNOSIS — E113293 Type 2 diabetes mellitus with mild nonproliferative diabetic retinopathy without macular edema, bilateral: Secondary | ICD-10-CM | POA: Diagnosis not present

## 2020-05-18 DIAGNOSIS — H353132 Nonexudative age-related macular degeneration, bilateral, intermediate dry stage: Secondary | ICD-10-CM | POA: Diagnosis not present

## 2020-05-31 DIAGNOSIS — Z23 Encounter for immunization: Secondary | ICD-10-CM | POA: Diagnosis not present

## 2020-05-31 DIAGNOSIS — E7800 Pure hypercholesterolemia, unspecified: Secondary | ICD-10-CM | POA: Diagnosis not present

## 2020-05-31 DIAGNOSIS — L400 Psoriasis vulgaris: Secondary | ICD-10-CM | POA: Diagnosis not present

## 2020-05-31 DIAGNOSIS — E1165 Type 2 diabetes mellitus with hyperglycemia: Secondary | ICD-10-CM | POA: Diagnosis not present

## 2020-06-10 DIAGNOSIS — L400 Psoriasis vulgaris: Secondary | ICD-10-CM | POA: Diagnosis not present

## 2020-08-13 DIAGNOSIS — F17210 Nicotine dependence, cigarettes, uncomplicated: Secondary | ICD-10-CM | POA: Diagnosis not present

## 2020-08-13 DIAGNOSIS — E559 Vitamin D deficiency, unspecified: Secondary | ICD-10-CM | POA: Diagnosis not present

## 2020-08-13 DIAGNOSIS — E1165 Type 2 diabetes mellitus with hyperglycemia: Secondary | ICD-10-CM | POA: Diagnosis not present

## 2020-08-13 DIAGNOSIS — R5383 Other fatigue: Secondary | ICD-10-CM | POA: Diagnosis not present

## 2020-08-13 DIAGNOSIS — Z1159 Encounter for screening for other viral diseases: Secondary | ICD-10-CM | POA: Diagnosis not present

## 2020-08-13 DIAGNOSIS — Z0000 Encounter for general adult medical examination without abnormal findings: Secondary | ICD-10-CM | POA: Diagnosis not present

## 2020-08-16 DIAGNOSIS — L400 Psoriasis vulgaris: Secondary | ICD-10-CM | POA: Diagnosis not present
